# Patient Record
Sex: Female | Born: 1995 | Race: White | Hispanic: No | Marital: Married
Health system: Southern US, Community
[De-identification: ages and names within clinical notes are randomized; demographics above are authoritative.]

## PROBLEM LIST (undated history)

## (undated) ENCOUNTER — Inpatient Hospital Stay (HOSPITAL_COMMUNITY): Payer: Self-pay

## (undated) DIAGNOSIS — Z789 Other specified health status: Secondary | ICD-10-CM

## (undated) HISTORY — PX: WISDOM TOOTH EXTRACTION: SHX21

---

## 2018-05-03 ENCOUNTER — Inpatient Hospital Stay (HOSPITAL_COMMUNITY)
Admission: AD | Admit: 2018-05-03 | Discharge: 2018-05-03 | Disposition: A | Discharge status: Home / Self Care | Payer: BLUE CROSS/BLUE SHIELD | Attending: Obstetrics | Admitting: Obstetrics

## 2018-05-03 ENCOUNTER — Inpatient Hospital Stay (HOSPITAL_COMMUNITY): Payer: BLUE CROSS/BLUE SHIELD

## 2018-05-03 ENCOUNTER — Encounter (HOSPITAL_COMMUNITY): Payer: Self-pay | Admitting: *Deleted

## 2018-05-03 ENCOUNTER — Other Ambulatory Visit: Payer: Self-pay

## 2018-05-03 DIAGNOSIS — O3680X0 Pregnancy with inconclusive fetal viability, not applicable or unspecified: Secondary | ICD-10-CM

## 2018-05-03 DIAGNOSIS — O209 Hemorrhage in early pregnancy, unspecified: Secondary | ICD-10-CM | POA: Diagnosis present

## 2018-05-03 DIAGNOSIS — Z3A01 Less than 8 weeks gestation of pregnancy: Secondary | ICD-10-CM | POA: Insufficient documentation

## 2018-05-03 HISTORY — DX: Other specified health status: Z78.9

## 2018-05-03 LAB — URINALYSIS, ROUTINE W REFLEX MICROSCOPIC
BILIRUBIN URINE: NEGATIVE
Bacteria, UA: NONE SEEN
Glucose, UA: NEGATIVE mg/dL
Ketones, ur: NEGATIVE mg/dL
Leukocytes, UA: NEGATIVE
NITRITE: NEGATIVE
Protein, ur: NEGATIVE mg/dL
Specific Gravity, Urine: 1.01 (ref 1.005–1.030)
pH: 6 (ref 5.0–8.0)

## 2018-05-03 LAB — WET PREP, GENITAL
CLUE CELLS WET PREP: NONE SEEN
Sperm: NONE SEEN
Trich, Wet Prep: NONE SEEN
Yeast Wet Prep HPF POC: NONE SEEN

## 2018-05-03 LAB — TYPE AND SCREEN
ABO/RH(D): A POS
Antibody Screen: NEGATIVE

## 2018-05-03 LAB — HCG, QUANTITATIVE, PREGNANCY: hCG, Beta Chain, Quant, S: 5783 m[IU]/mL — ABNORMAL HIGH (ref <5)

## 2018-05-03 LAB — POCT PREGNANCY, URINE: Preg Test, Ur: POSITIVE — AB

## 2018-05-03 LAB — ABO/RH: ABO/RH(D): A POS

## 2018-05-03 NOTE — MAU Note (Signed)
Having quite a bit of bleeding and cramping, started yesterday morning.  Was worse yesterday.   Has not been seen yet, first appt is Thursday

## 2018-05-03 NOTE — Discharge Instructions (Signed)
Return to Maternity Admissions Unit if you soak through a pad/hour x  Hours or pain is not relieved by Tylenol.

## 2018-05-03 NOTE — MAU Provider Note (Signed)
History     CSN: [id number]  Arrival date and time: 05/03/18 1139   First Provider Initiated Contact with Patient 05/03/18 1454      Chief Complaint  Patient presents with  . Vaginal Bleeding  . Abdominal Pain  . Possible Pregnancy   HPI  [name] is a 23 y.o. year old G1P0 female at 7495w6d weeks gestation who presents to MAU reporting small amount of VB and abdominal cramping that was worse on 05/02/2018. She reports the last SI was "about 1 month ago." [facility]. She has her first OB appt on Thursday 05/06/2018.   Past Medical History:  Diagnosis Date  . Medical history non-contributory     Past Surgical History:  Procedure Laterality Date  . WISDOM TOOTH EXTRACTION      Family History  Problem Relation Age of Onset  . Cancer Paternal Aunt   . Cancer Maternal Grandfather   . Cancer Paternal Grandmother     Social History   Tobacco Use  . Smoking status: Not on file  Substance Use Topics  . Alcohol use: Not Currently  . Drug use: Never    Allergies: No Known Allergies  No medications prior to admission.    Review of Systems  Constitutional: Negative.   HENT: Negative.   Eyes: Negative.   Respiratory: Negative.   Cardiovascular: Negative.   Gastrointestinal: Negative.   Endocrine: Negative.   Genitourinary: Positive for pelvic pain and vaginal bleeding.  Musculoskeletal: Negative.   Skin: Negative.   Allergic/Immunologic: Negative.   Neurological: Negative.   Hematological: Negative.   Psychiatric/Behavioral: Negative.    Physical Exam   Blood pressure 98/63, pulse 94, temperature 98.4 F (36.9 C), temperature source Oral, resp. rate 18, height 5\' 4"  (1.626 m), weight 43.5 kg, last menstrual period 03/16/2018, SpO2 100 %.  Physical Exam  Nursing note and vitals reviewed. Constitutional: She is oriented to person, place, and time. She appears well-developed and well-nourished.  HENT:  Head: Normocephalic and  atraumatic.  Eyes: Pupils are equal, round, and reactive to light.  Neck: Normal range of motion.  Cardiovascular: Normal rate.  Respiratory: Effort normal.  GI: Soft.  Genitourinary:    Genitourinary Comments: Uterus: non-tender, SE: cervix is smooth, pink, no lesions, small amt of thick, brownish red blood in vaginal vault and cervical os -- WP, GC/CT done, closed/long/firm, no CMT or friability, no adnexal tenderness    Musculoskeletal: Normal range of motion.  Neurological: She is alert and oriented to person, place, and time.  Skin: Skin is warm and dry.  Psychiatric: She has a normal mood and affect. Her behavior is normal. Judgment and thought content normal.    MAU Course  Procedures  MDM [facility] UPT CBC ABO/Rh HCG Wet Prep GC/CT -- pending OB < 14 wks US with TV  Results for orders placed or performed during the hospital encounter of 05/03/18 (from the past 24 hour(s))  Urinalysis, Routine w reflex microscopic     Status: Abnormal   Collection Time: 05/03/18 12:31 PM  Result Value Ref Range   Color, Urine YELLOW YELLOW   APPearance CLEAR CLEAR   Specific Gravity, Urine 1.010 1.005 - 1.030   pH 6.0 5.0 - 8.0   Glucose, UA NEGATIVE NEGATIVE mg/dL   Hgb urine dipstick MODERATE (A) NEGATIVE   Bilirubin Urine NEGATIVE NEGATIVE   Ketones, ur NEGATIVE NEGATIVE mg/dL   Protein, ur NEGATIVE NEGATIVE mg/dL   Nitrite NEGATIVE NEGATIVE  Leukocytes, UA NEGATIVE NEGATIVE   RBC / HPF 0-5 0 - 5 RBC/hpf   WBC, UA 0-5 0 - 5 WBC/hpf   Bacteria, UA NONE SEEN NONE SEEN   Squamous Epithelial / LPF 0-5 0 - 5   Mucus PRESENT   Pregnancy, urine POC     Status: Abnormal   Collection Time: 05/03/18 12:36 PM  Result Value Ref Range   Preg Test, Ur POSITIVE (A) NEGATIVE  Type and screen     Status: None   Collection Time: 05/03/18  1:29 PM  Result Value Ref Range   ABO/RH(D) A POS    Antibody Screen NEG    Sample Expiration      05/06/2018 Performed at [facility], [address] [place], [place] [address]   hCG, quantitative, pregnancy     Status: Abnormal   Collection Time: 05/03/18  1:29 PM  Result Value Ref Range   hCG, Beta Chain, Quant, S 5,783 (H) <5 mIU/mL  ABO/Rh     Status: None   Collection Time: 05/03/18  1:29 PM  Result Value Ref Range   ABO/RH(D)      A POS Performed at [facility], [address], [place], [place] [address]   Wet prep, genital     Status: Abnormal   Collection Time: 05/03/18  3:25 PM  Result Value Ref Range   Yeast Wet Prep HPF POC NONE SEEN NONE SEEN   Trich, Wet Prep NONE SEEN NONE SEEN   Clue Cells Wet Prep HPF POC NONE SEEN NONE SEEN   WBC, Wet Prep HPF POC FEW (A) NONE SEEN   Sperm NONE SEEN     US Ob Less Than 14 Weeks With Ob Transvaginal  Result Date: 05/03/2018 CLINICAL DATA:  Vaginal bleeding.  Positive pregnancy test. EXAM: OBSTETRIC <14 WK [place] AND TRANSVAGINAL OB US TECHNIQUE: Both transabdominal and transvaginal ultrasound examinations were performed for complete evaluation of the gestation as well as the maternal uterus, adnexal regions, and pelvic cul-de-sac. Transvaginal technique was performed to assess early pregnancy. COMPARISON:  None. FINDINGS: Intrauterine gestational sac: None Yolk sac:  Not Visualized. Embryo:  Not Visualized. Cardiac Activity: Not Visualized. Maternal uterus/adnexae: No adnexal mass identified. No free fluid in pelvis. Right ovarian corpus luteum. Normal left ovary. IMPRESSION: No intrauterine gestation identified. In the setting of positive pregnancy test and no definite intrauterine pregnancy, this reflects a pregnancy of unknown location. Differential considerations include early normal IUP, abnormal IUP, or nonvisualized ectopic pregnancy. Differentiation is achieved with serial beta HCG supplemented by repeat sonography as clinically warranted. Electronically Signed   By: Annia Belt M.D.   On: 05/03/2018 16:21     Assessment and Plan  Pregnancy of unknown anatomic location   - TC to [facility] to arrange F/U for patient to have rpt HCG on Wednesday 05/05/2018 -- spoke to [place], [place] who will have to ask Dr. Chestine Spore in the morning about F/U. Misty Stanley, RN will call patient tomorrow to advise her if she she is return to MAU for rpt HCG or [facility]  Bleeding in early pregnancy - Return to care   If you have heavier bleeding that soaks through more that 2 pads per hour for an hour or more  If you bleed so much that you feel like you might pass out or you do pass out  If you have significant abdominal pain that is not improved with Tylenol   If you develop a fever > 100.5 - Information provided on VB in  pregnancy  - Discharge patient - Patient verbalized an understanding of the plan of care and agrees.     Raelyn Moraolitta Simrah Chatham, MSN, CNM 05/03/2018, 2:54 PM

## 2018-05-04 LAB — GC/CHLAMYDIA PROBE AMP (~~LOC~~) NOT AT ARMC
Chlamydia: NEGATIVE
Neisseria Gonorrhea: NEGATIVE

## 2019-02-07 DIAGNOSIS — Z0000 Encounter for general adult medical examination without abnormal findings: Secondary | ICD-10-CM | POA: Diagnosis not present

## 2019-02-07 DIAGNOSIS — Z131 Encounter for screening for diabetes mellitus: Secondary | ICD-10-CM | POA: Diagnosis not present

## 2019-02-07 DIAGNOSIS — Z13220 Encounter for screening for lipoid disorders: Secondary | ICD-10-CM | POA: Diagnosis not present

## 2019-02-07 DIAGNOSIS — Z23 Encounter for immunization: Secondary | ICD-10-CM | POA: Diagnosis not present

## 2019-02-07 DIAGNOSIS — Z1331 Encounter for screening for depression: Secondary | ICD-10-CM | POA: Diagnosis not present

## 2019-03-15 ENCOUNTER — Encounter (HOSPITAL_COMMUNITY): Payer: Self-pay

## 2019-03-17 DIAGNOSIS — D2261 Melanocytic nevi of right upper limb, including shoulder: Secondary | ICD-10-CM | POA: Diagnosis not present

## 2019-03-17 DIAGNOSIS — D2272 Melanocytic nevi of left lower limb, including hip: Secondary | ICD-10-CM | POA: Diagnosis not present

## 2019-03-17 DIAGNOSIS — D225 Melanocytic nevi of trunk: Secondary | ICD-10-CM | POA: Diagnosis not present

## 2019-03-17 DIAGNOSIS — D2262 Melanocytic nevi of left upper limb, including shoulder: Secondary | ICD-10-CM | POA: Diagnosis not present

## 2019-06-06 DIAGNOSIS — Z681 Body mass index (BMI) 19 or less, adult: Secondary | ICD-10-CM | POA: Diagnosis not present

## 2019-06-06 DIAGNOSIS — Z01419 Encounter for gynecological examination (general) (routine) without abnormal findings: Secondary | ICD-10-CM | POA: Diagnosis not present

## 2019-06-06 DIAGNOSIS — N979 Female infertility, unspecified: Secondary | ICD-10-CM | POA: Diagnosis not present

## 2019-08-24 DIAGNOSIS — N96 Recurrent pregnancy loss: Secondary | ICD-10-CM | POA: Diagnosis not present

## 2019-08-24 DIAGNOSIS — Z319 Encounter for procreative management, unspecified: Secondary | ICD-10-CM | POA: Diagnosis not present

## 2019-08-24 DIAGNOSIS — E288 Other ovarian dysfunction: Secondary | ICD-10-CM | POA: Diagnosis not present

## 2019-09-19 DIAGNOSIS — N96 Recurrent pregnancy loss: Secondary | ICD-10-CM | POA: Diagnosis not present

## 2019-09-19 DIAGNOSIS — E288 Other ovarian dysfunction: Secondary | ICD-10-CM | POA: Diagnosis not present

## 2019-09-19 DIAGNOSIS — Z319 Encounter for procreative management, unspecified: Secondary | ICD-10-CM | POA: Diagnosis not present

## 2019-09-27 DIAGNOSIS — Z319 Encounter for procreative management, unspecified: Secondary | ICD-10-CM | POA: Diagnosis not present

## 2019-09-27 DIAGNOSIS — E288 Other ovarian dysfunction: Secondary | ICD-10-CM | POA: Diagnosis not present

## 2019-10-14 DIAGNOSIS — E288 Other ovarian dysfunction: Secondary | ICD-10-CM | POA: Diagnosis not present

## 2019-10-14 DIAGNOSIS — Z3141 Encounter for fertility testing: Secondary | ICD-10-CM | POA: Diagnosis not present

## 2019-10-14 DIAGNOSIS — N96 Recurrent pregnancy loss: Secondary | ICD-10-CM | POA: Diagnosis not present

## 2019-10-14 DIAGNOSIS — Z319 Encounter for procreative management, unspecified: Secondary | ICD-10-CM | POA: Diagnosis not present

## 2020-01-04 DIAGNOSIS — R109 Unspecified abdominal pain: Secondary | ICD-10-CM | POA: Diagnosis not present

## 2020-01-04 DIAGNOSIS — Z681 Body mass index (BMI) 19 or less, adult: Secondary | ICD-10-CM | POA: Diagnosis not present

## 2020-01-05 DIAGNOSIS — R109 Unspecified abdominal pain: Secondary | ICD-10-CM | POA: Diagnosis not present

## 2020-01-05 DIAGNOSIS — R1110 Vomiting, unspecified: Secondary | ICD-10-CM | POA: Diagnosis not present

## 2020-01-05 DIAGNOSIS — R112 Nausea with vomiting, unspecified: Secondary | ICD-10-CM | POA: Diagnosis not present

## 2020-03-19 IMAGING — US US OB < 14 WEEKS - US OB TV
1 series · 15 of 26 positions shown · non-contrast
Comparison: None.

CLINICAL DATA: Vaginal bleeding.  Positive pregnancy test.

EXAM:
OBSTETRIC <14 WK US AND TRANSVAGINAL OB US
TECHNIQUE: Both transabdominal and transvaginal ultrasound examinations were
performed for complete evaluation of the gestation as well as the
maternal uterus, adnexal regions, and pelvic cul-de-sac.
Transvaginal technique was performed to assess early pregnancy.

[Series 1: us ob < 14 weeks - us ob tv · 15 of 26 slices shown]
[im 1/26]
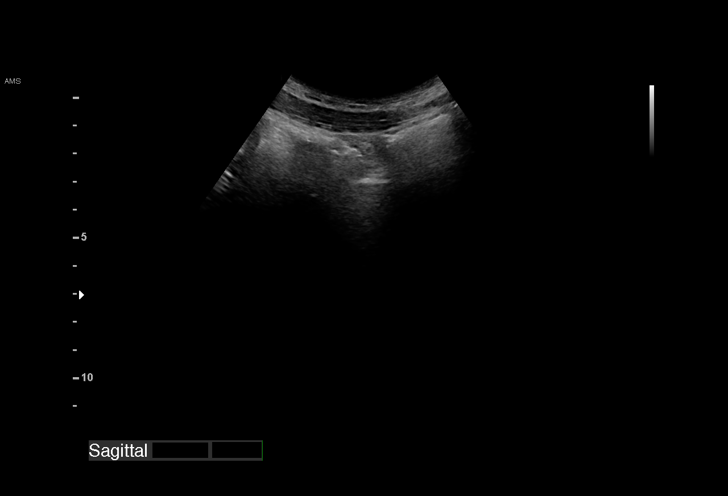
[im 3/26]
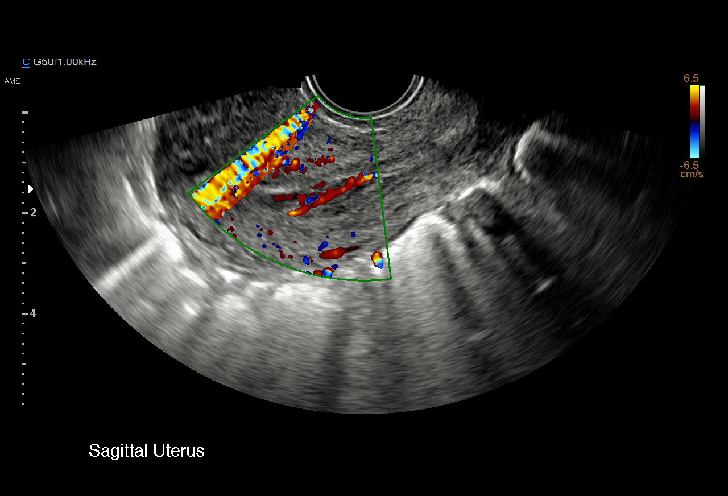
[im 5/26]
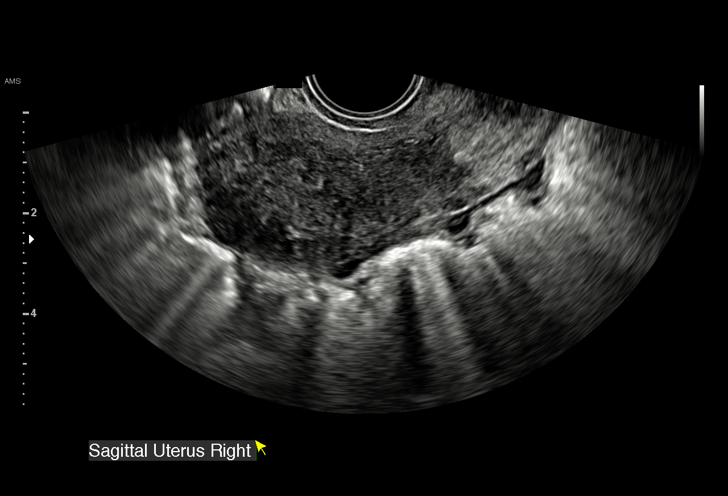
[im 7/26]
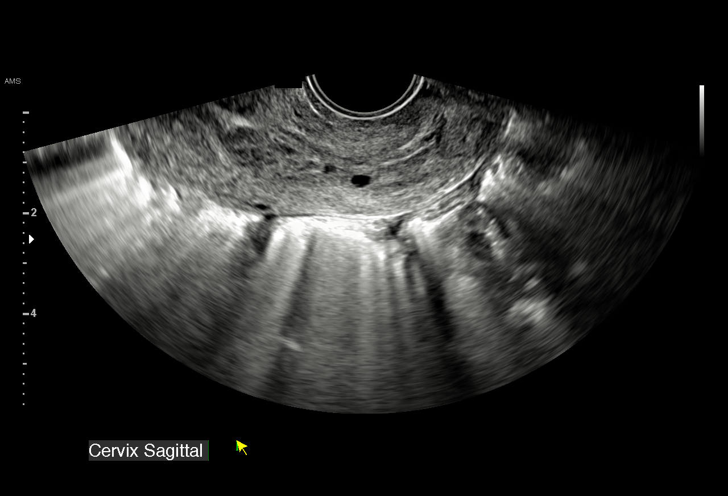
[im 8/26]
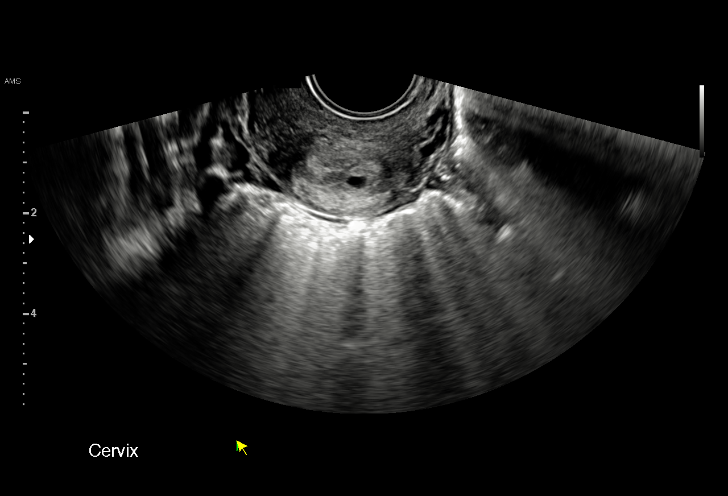
[im 10/26]
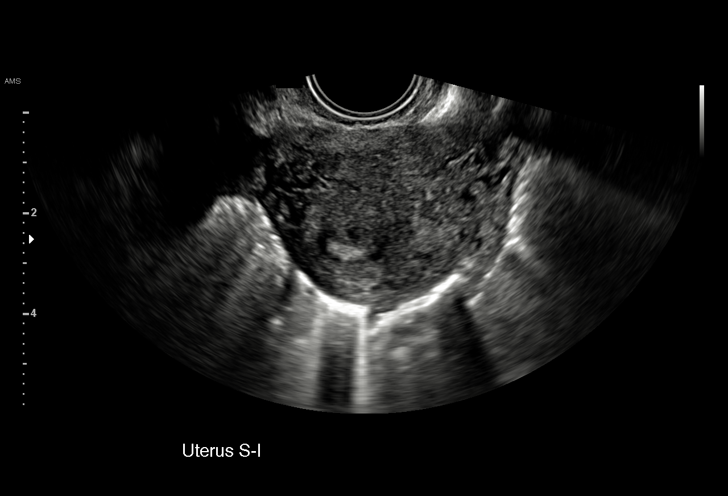
[im 12/26]
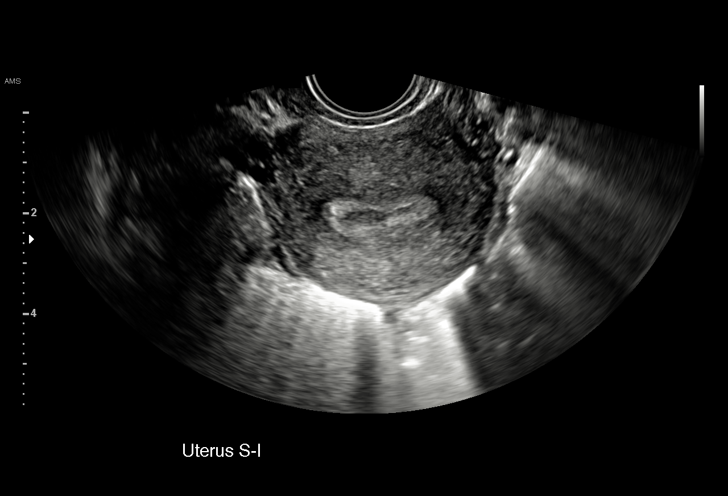
[im 14/26]
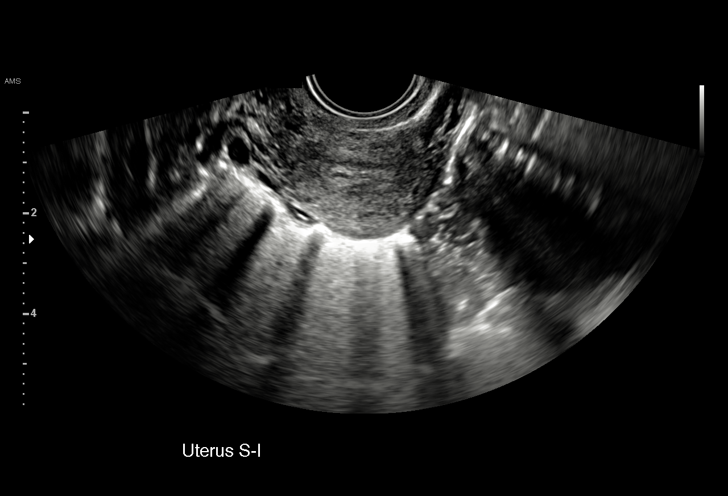
[im 15/26]
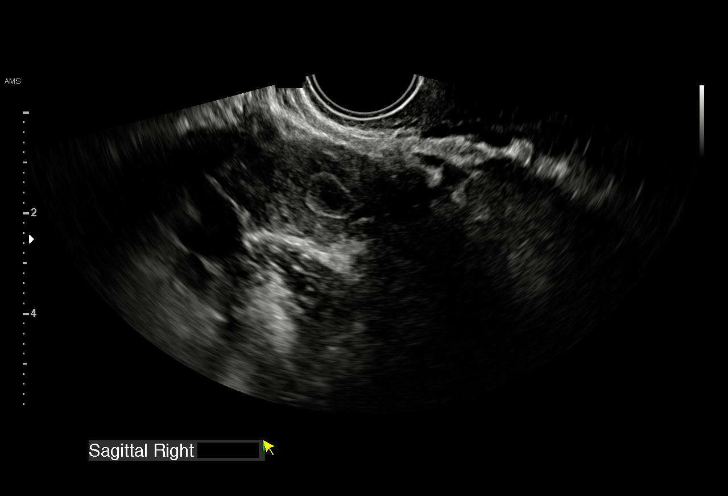
[im 17/26]
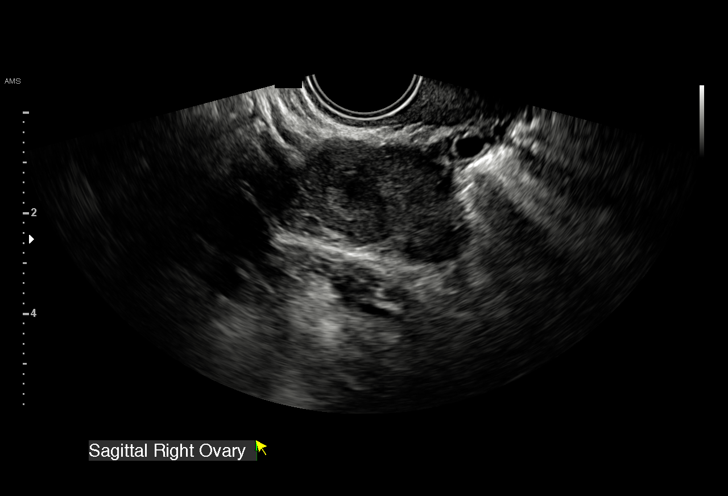
[im 19/26]
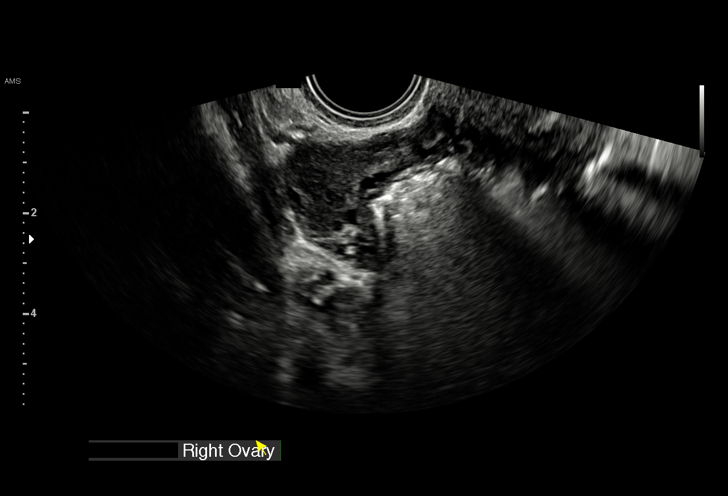
[im 20/26]
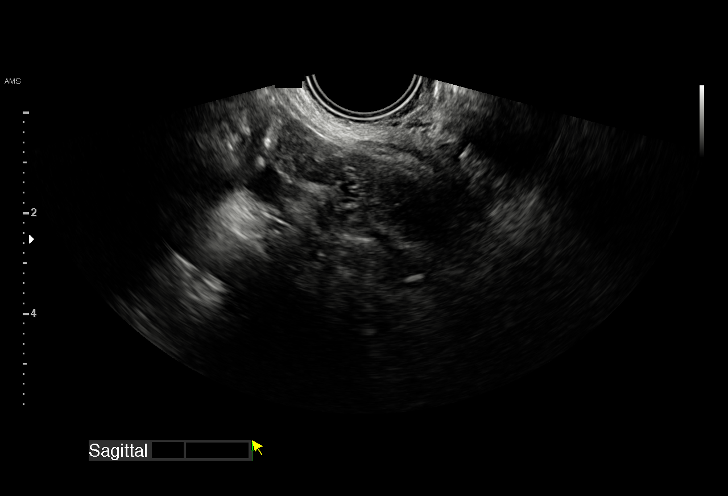
[im 22/26]
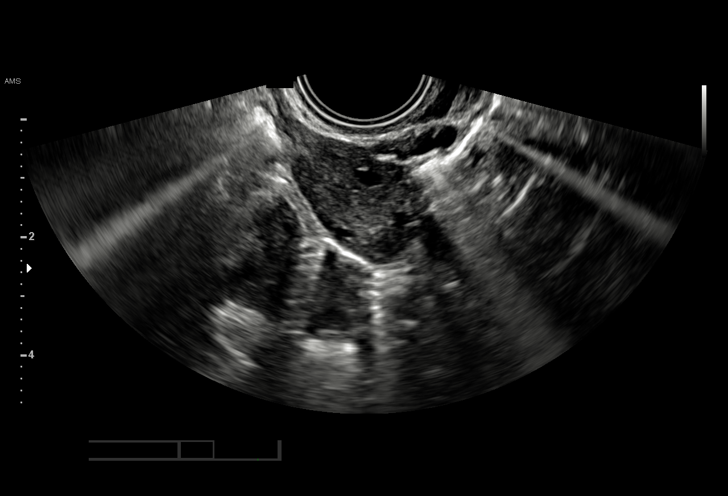
[im 24/26]
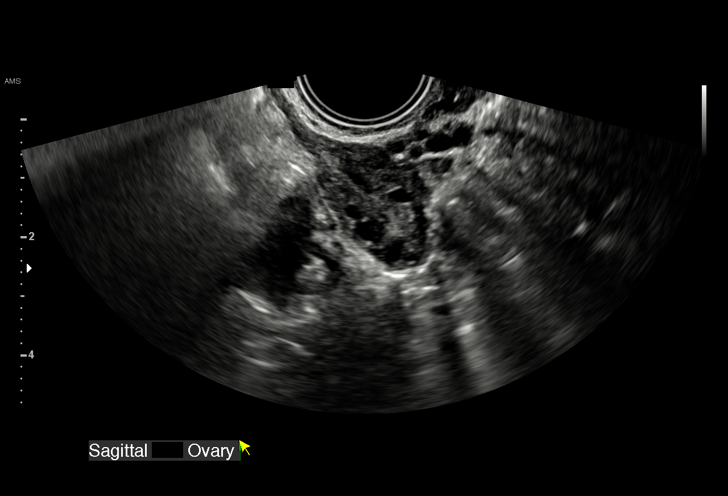
[im 26/26]
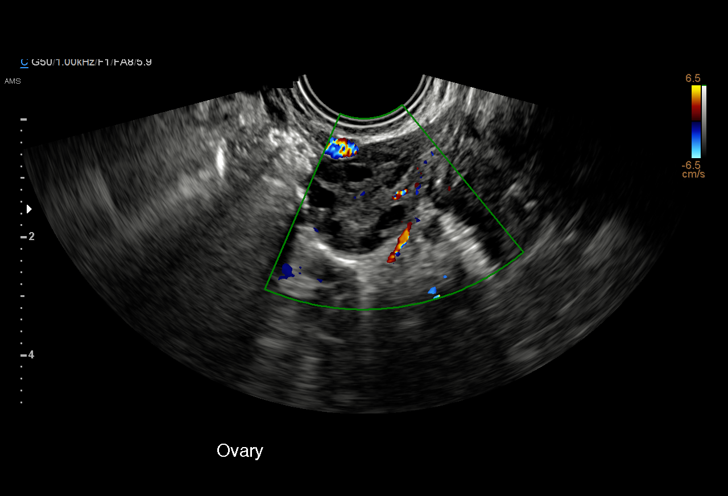

[15 of 26 positions shown; findings below may reference images not displayed]

FINDINGS: Intrauterine gestational sac: None

Yolk sac:  Not Visualized.

Embryo:  Not Visualized.

Cardiac Activity: Not Visualized.

Maternal uterus/adnexae: No adnexal mass identified. No free fluid
in pelvis. Right ovarian corpus luteum. Normal left ovary.
IMPRESSION: No intrauterine gestation identified. In the setting of positive
pregnancy test and no definite intrauterine pregnancy, this reflects
a pregnancy of unknown location. Differential considerations include
early normal IUP, abnormal IUP, or nonvisualized ectopic pregnancy.
Differentiation is achieved with serial beta HCG supplemented by
repeat sonography as clinically warranted.

## 2020-03-22 DIAGNOSIS — L700 Acne vulgaris: Secondary | ICD-10-CM | POA: Diagnosis not present

## 2020-03-22 DIAGNOSIS — D2261 Melanocytic nevi of right upper limb, including shoulder: Secondary | ICD-10-CM | POA: Diagnosis not present

## 2020-03-22 DIAGNOSIS — D225 Melanocytic nevi of trunk: Secondary | ICD-10-CM | POA: Diagnosis not present

## 2020-03-22 DIAGNOSIS — D2262 Melanocytic nevi of left upper limb, including shoulder: Secondary | ICD-10-CM | POA: Diagnosis not present

## 2020-03-22 DIAGNOSIS — D485 Neoplasm of uncertain behavior of skin: Secondary | ICD-10-CM | POA: Diagnosis not present

## 2020-10-19 ENCOUNTER — Inpatient Hospital Stay (HOSPITAL_COMMUNITY)
Admission: AD | Admit: 2020-10-19 | Discharge: 2020-10-19 | Disposition: A | Discharge status: Home / Self Care | Payer: Federal, State, Local not specified - PPO | Attending: Obstetrics and Gynecology | Admitting: Obstetrics and Gynecology

## 2020-10-19 ENCOUNTER — Encounter (HOSPITAL_COMMUNITY): Payer: Self-pay

## 2020-10-19 ENCOUNTER — Other Ambulatory Visit: Payer: Self-pay

## 2020-10-19 ENCOUNTER — Inpatient Hospital Stay (HOSPITAL_COMMUNITY): Payer: Federal, State, Local not specified - PPO

## 2020-10-19 DIAGNOSIS — R109 Unspecified abdominal pain: Secondary | ICD-10-CM | POA: Insufficient documentation

## 2020-10-19 DIAGNOSIS — Z6790 Unspecified blood type, Rh positive: Secondary | ICD-10-CM

## 2020-10-19 DIAGNOSIS — O00201 Right ovarian pregnancy without intrauterine pregnancy: Secondary | ICD-10-CM | POA: Insufficient documentation

## 2020-10-19 DIAGNOSIS — O26899 Other specified pregnancy related conditions, unspecified trimester: Secondary | ICD-10-CM

## 2020-10-19 DIAGNOSIS — Z3A08 8 weeks gestation of pregnancy: Secondary | ICD-10-CM | POA: Diagnosis not present

## 2020-10-19 DIAGNOSIS — O26891 Other specified pregnancy related conditions, first trimester: Secondary | ICD-10-CM | POA: Insufficient documentation

## 2020-10-19 LAB — COMPREHENSIVE METABOLIC PANEL
ALT: 21 U/L (ref 0–44)
AST: 20 U/L (ref 15–41)
Albumin: 4.4 g/dL (ref 3.5–5.0)
Alkaline Phosphatase: 35 U/L — ABNORMAL LOW (ref 38–126)
Anion gap: 9 (ref 5–15)
BUN: 13 mg/dL (ref 6–20)
CO2: 27 mmol/L (ref 22–32)
Calcium: 9.5 mg/dL (ref 8.9–10.3)
Chloride: 101 mmol/L (ref 98–111)
Creatinine, Ser: 0.68 mg/dL (ref 0.44–1.00)
GFR, Estimated: >60 mL/min (ref >60)
Glucose, Bld: 86 mg/dL (ref 70–99)
Potassium: 3.5 mmol/L (ref 3.5–5.1)
Sodium: 137 mmol/L (ref 135–145)
Total Bilirubin: 1.1 mg/dL (ref 0.3–1.2)
Total Protein: 7 g/dL (ref 6.5–8.1)

## 2020-10-19 LAB — CBC
HCT: 41.9 % (ref 36.0–46.0)
Hemoglobin: 14.4 g/dL (ref 12.0–15.0)
MCH: 31.6 pg (ref 26.0–34.0)
MCHC: 34.4 g/dL (ref 30.0–36.0)
MCV: 91.9 fL (ref 80.0–100.0)
Platelets: 231 10*3/uL (ref 150–400)
RBC: 4.56 MIL/uL (ref 3.87–5.11)
RDW: 12 % (ref 11.5–15.5)
WBC: 8.5 10*3/uL (ref 4.0–10.5)
nRBC: 0 % (ref 0.0–0.2)

## 2020-10-19 LAB — HCG, QUANTITATIVE, PREGNANCY: hCG, Beta Chain, Quant, S: 6864 m[IU]/mL — ABNORMAL HIGH (ref <5)

## 2020-10-19 MED ORDER — METHOTREXATE FOR ECTOPIC PREGNANCY
50.0000 mg/m2 | Freq: Once | INTRAMUSCULAR | Status: AC
  Administered 2020-10-19: 71.5 mg via INTRAMUSCULAR
  Filled 2020-10-19: qty 2.86

## 2020-10-19 NOTE — MAU Note (Signed)
Kim Frost is a 25 y.o. here in MAU reporting: was told to come in for an u/s by Dr Timothy Lasso due to abnormal rise in hcg. Hcg on 6/30 was 3392 and then yesterday it was 4900. Is not having any pain or bleeding.  LMP: 08/21/2020  Onset of complaint: ongoing  Pain score: 0/10  Vitals:   10/19/20 1749  BP: 121/85  Pulse: 78  Resp: 16  Temp: 97.8 F (36.6 C)  SpO2: 100%     Lab orders placed from triage: none

## 2020-10-19 NOTE — MAU Provider Note (Addendum)
History     CSN: [id number]  Arrival date and time: 10/19/20 1703   Chief Complaint  Patient presents with   Ultrasound   25 y.o. G2P0010 @[redacted]w[redacted]d  by LMP presenting for . Reports abnormal rising qhcg. Denies VB and pain today but was having spotting prior to. She is getting care at [facility].   OB History     Gravida  2   Para      Term      Preterm      AB      Living         SAB      IAB      Ectopic      Multiple      Live Births              Past Medical History:  Diagnosis Date   Medical history non-contributory     Past Surgical History:  Procedure Laterality Date   WISDOM TOOTH EXTRACTION      Family History  Problem Relation Age of Onset   Cancer Paternal Aunt    Cancer Maternal Grandfather    Cancer Paternal Grandmother     Social History   Substance Use Topics   Alcohol use: Not Currently   Drug use: Never    Allergies: No Known Allergies  No medications prior to admission.    Review of Systems  Gastrointestinal:  Negative for abdominal pain.  Genitourinary:  Negative for vaginal bleeding.  Physical Exam   Blood pressure 100/65, pulse 75, temperature 97.8 F (36.6 C), temperature source Oral, resp. rate 16, height 5\' 4"  (1.626 m), weight 45.5 kg, last menstrual period 08/21/2020, SpO2 100 %, unknown if currently breastfeeding.  Physical Exam Vitals and nursing note reviewed. Exam conducted with a chaperone present.  Constitutional:      Appearance: Normal appearance.  HENT:     Head: Normocephalic and atraumatic.  Cardiovascular:     Rate and Rhythm: Normal rate.  Pulmonary:     Effort: Pulmonary effort is normal. No respiratory distress.  Abdominal:     General: There is no distension.     Palpations: Abdomen is soft. There is no mass.     Tenderness: There is no abdominal tenderness. There is no guarding or rebound.     Hernia: No hernia is present.  Musculoskeletal:        General: Normal range of  motion.     Cervical back: Normal range of motion.  Skin:    General: Skin is warm and dry.  Neurological:     General: No focal deficit present.     Mental Status: She is alert and oriented to person, place, and time.  Psychiatric:        Mood and Affect: Mood normal.        Behavior: Behavior normal.   Results for orders placed or performed during the hospital encounter of 10/19/20 (from the past 24 hour(s))  CBC     Status: None   Collection Time: 10/19/20  6:18 PM  Result Value Ref Range   WBC 8.5 4.0 - 10.5 K/uL   RBC 4.56 3.87 - 5.11 MIL/uL   Hemoglobin 14.4 12.0 - 15.0 g/dL   HCT 12/20/20 12/20/20 - 88.2 %   MCV 91.9 80.0 - 100.0 fL   MCH 31.6 26.0 - 34.0 pg   MCHC 34.4 30.0 - 36.0 g/dL   RDW 80.0 34.9 - 17.9 %   Platelets 231 150 -  400 K/uL   nRBC 0.0 0.0 - 0.2 %  Comprehensive metabolic panel     Status: Abnormal   Collection Time: 10/19/20  6:18 PM  Result Value Ref Range   Sodium 137 135 - 145 mmol/L   Potassium 3.5 3.5 - 5.1 mmol/L   Chloride 101 98 - 111 mmol/L   CO2 27 22 - 32 mmol/L   Glucose, Bld 86 70 - 99 mg/dL   BUN 13 6 - 20 mg/dL   Creatinine, Ser 6.96 0.44 - 1.00 mg/dL   Calcium 9.5 8.9 - 29.5 mg/dL   Total Protein 7.0 6.5 - 8.1 g/dL   Albumin 4.4 3.5 - 5.0 g/dL   AST 20 15 - 41 U/L   ALT 21 0 - 44 U/L   Alkaline Phosphatase 35 (L) 38 - 126 U/L   Total Bilirubin 1.1 0.3 - 1.2 mg/dL   GFR, Estimated >28 >41 mL/min   Anion gap 9 5 - 15  hCG, quantitative, pregnancy     Status: Abnormal   Collection Time: 10/19/20  6:18 PM  Result Value Ref Range   hCG, Beta Chain, Quant, S 6,864 (H) <5 mIU/mL    US OB LESS THAN 14 WEEKS WITH OB TRANSVAGINAL  Result Date: 10/19/2020 CLINICAL DATA:  25 year old pregnant female with abnormal rise in HCG. LMP: 08/21/2020 corresponding to an estimated gestational age of [redacted] weeks, 3 days. EXAM: OBSTETRIC <14 WK [place] AND TRANSVAGINAL OB US TECHNIQUE: Both transabdominal and transvaginal ultrasound examinations were performed for  complete evaluation of the gestation as well as the maternal uterus, adnexal regions, and pelvic cul-de-sac. Transvaginal technique was performed to assess early pregnancy. COMPARISON:  None. FINDINGS: The uterus is anteverted and appears unremarkable. The endometrium measures approximately 9 mm in thickness and appears unremarkable. No intrauterine pregnancy identified. The right ovary measures 3.4 x 2.5 x 3.2 cm and appears unremarkable. A corpus luteum is noted in the right ovary. There is a 1.9 x 2.0 x 1.7 cm complex mass with central cystic or sac-like component superior and medial to the right ovary. The this mass appears to be separate from the right ovary and most concerning for an ectopic pregnancy. No fetal pole or yolk sac identified within the sac-like component of this mass. The left ovary is unremarkable and measures 3.2 x 2.1 x 1.8 cm. No significant free fluid within the pelvis. IMPRESSION: 1. No intrauterine pregnancy identified. 2. Findings most concerning for a right adnexal ectopic pregnancy. Clinical correlation is recommended. These results were called by telephone at the time of interpretation on 10/19/2020 at 7:56 pm to provider [facility] , who verbally acknowledged these results. Electronically Signed   By: Elgie Collard M.D.   On: 10/19/2020 19:57    MAU Course  Procedures MTX  MDM TC to Dr. Timothy Lasso requesting more information including quant results from office> qhcg on 6/30 was 3300, qhcg on 7/7 was 4900. [place] ordered today.  Labs and [place] reviewed. Discussed presentation and clinical findings with Dr. Charlotta Newton and Dr. Timothy Lasso, both ok with offering MTX or surgical mngt. Pt and partner notified of US findings, options of mngt discussed in detail. Pt prefers MTX. The risks of methotrexate were reviewed including failure requiring repeat dosing or eventual surgery. She understands that methotrexate involves frequent return visits to monitor lab values and that she remains at risk  of ectopic rupture until her beta is less than assay. The patient opts to proceed with methotrexate. She has no history of hepatic or  renal dysfunction, has normal BUN/Cr/LFT's/platelets. She is felt to be reliable for follow-up. Side effects of photosensitivity & GI upset were discussed. She knows to avoid direct sunlight and abstain from alcohol, aspirin and aspirin-like products for two weeks. She was counseled to discontinue any MVI with folic acid. She understands to follow up on D4 (7/11) and D7 (7/14) for repeat BHCG and was given the instruction sheet. Strict ectopic precautions were reviewed, the patient knows to call with any abdominal pain, vomiting, fainting, or any concerns with her health. Rh pos. Dr. Timothy Lasso updated with plan and will arrange f/u in office on 10/22/20 am.  MTX given, pt stable for discharge.  Assessment and Plan   1. Ectopic pregnancy of right ovary   2. Abdominal pain affecting pregnancy   3. Blood type, Rh positive    Discharge home Follow up at [facility] on 10/22/20 b/w 8:30-11 for labs Strict ectopic precautions Avoid NSAIDS and folic acid  Allergies as of 10/19/2020   No Known Allergies      Medication List     TAKE these medications    prenatal multivitamin Tabs tablet Take 1 tablet by mouth at bedtime.       Donette Larry, CNM 10/19/20, 2100

## 2020-10-20 ENCOUNTER — Encounter (HOSPITAL_COMMUNITY): Payer: Self-pay | Admitting: Obstetrics and Gynecology

## 2020-10-22 ENCOUNTER — Inpatient Hospital Stay (HOSPITAL_COMMUNITY)
Admission: AD | Admit: 2020-10-22 | Discharge: 2020-10-22 | Disposition: A | Discharge status: Home / Self Care | Payer: Federal, State, Local not specified - PPO | Attending: Obstetrics & Gynecology | Admitting: Obstetrics & Gynecology

## 2020-10-22 ENCOUNTER — Other Ambulatory Visit: Payer: Self-pay

## 2020-10-22 DIAGNOSIS — Z7901 Long term (current) use of anticoagulants: Secondary | ICD-10-CM | POA: Diagnosis not present

## 2020-10-22 DIAGNOSIS — O26891 Other specified pregnancy related conditions, first trimester: Secondary | ICD-10-CM | POA: Diagnosis present

## 2020-10-22 DIAGNOSIS — R1031 Right lower quadrant pain: Secondary | ICD-10-CM | POA: Diagnosis not present

## 2020-10-22 DIAGNOSIS — O00201 Right ovarian pregnancy without intrauterine pregnancy: Secondary | ICD-10-CM | POA: Diagnosis not present

## 2020-10-22 DIAGNOSIS — Z3A08 8 weeks gestation of pregnancy: Secondary | ICD-10-CM | POA: Diagnosis not present

## 2020-10-22 DIAGNOSIS — O26851 Spotting complicating pregnancy, first trimester: Secondary | ICD-10-CM | POA: Insufficient documentation

## 2020-10-22 LAB — HCG, QUANTITATIVE, PREGNANCY: hCG, Beta Chain, Quant, S: 1768 m[IU]/mL — ABNORMAL HIGH (ref <5)

## 2020-10-22 NOTE — MAU Note (Signed)
Started having pain yesterday afternoon, just never went away.  Took some Tylenol last night.- never helped.  Called office.  Told to come in. Cramping in RLQ- goes down rt leg. Started bleeding this morning, small amt.  Today is day 4 post Methotrexate.

## 2020-10-22 NOTE — MAU Provider Note (Signed)
Chief Complaint:  Abdominal Pain and Vaginal Bleeding   Event Date/Time   First Provider Initiated Contact with Patient 10/22/20 1010     HPI: Kim Frost is a 25 y.o. G2P0010 at [redacted]w[redacted]d who presents to maternity admissions reporting cramping. Patient reports gradual onset of constant, dull, right lower quadrant cramping that started yesterday afternoon and has continued to linger throughout the morning. Reports she "just couldn't get comfortable or sleep". She rates her pain 4/10. She took 1 500mg  Tylenol last night, which did not help her pain. Started having some light spotting this morning. She was diagnosed with a right ectopic pregnancy on Friday which she received MTX. She called the office as she was suppose to have follow up today for hcg level, however was told to come in for further evaluation.   Pregnancy Course:   Past Medical History:  Diagnosis Date   Medical history non-contributory    OB History  Gravida Para Term Preterm AB Living  2       1    SAB IAB Ectopic Multiple Live Births  1            # Outcome Date GA Lbr Len/2nd Weight Sex Delivery Anes PTL Lv  2 Current           1 SAB  [redacted]w[redacted]d          Past Surgical History:  Procedure Laterality Date   WISDOM TOOTH EXTRACTION     Family History  Problem Relation Age of Onset   Cancer Paternal Aunt    Cancer Maternal Grandfather    Cancer Paternal Grandmother    Social History   Substance Use Topics   Alcohol use: Not Currently   Drug use: Never   No Known Allergies Medications Prior to Admission  Medication Sig Dispense Refill Last Dose   Prenatal Vit-Fe Fumarate-FA (PRENATAL MULTIVITAMIN) TABS tablet Take 1 tablet by mouth at bedtime.      I have reviewed patient's Past Medical Hx, Surgical Hx, Family Hx, Social Hx, medications and allergies.   ROS:  Review of Systems  Constitutional: Negative.   Respiratory: Negative.    Cardiovascular: Negative.   Gastrointestinal:  Positive for abdominal pain and  nausea. Negative for diarrhea and vomiting.       Right lower quadrant   Genitourinary:  Negative for dysuria, pelvic pain and vaginal discharge.       Spotting   Musculoskeletal: Negative.   Neurological: Negative.   Psychiatric/Behavioral: Negative.     Physical Exam  Patient Vitals for the past 24 hrs:  BP Temp Temp src Pulse Resp SpO2 Height Weight  10/22/20 1002 128/78 98.1 F (36.7 C) Oral 71 16 100 % 5\' 4"  (1.626 m) 44.9 kg   Constitutional: well-developed, well-nourished female in no acute distress.  Cardiovascular: normal rate Respiratory: normal effort GI: abd flat, soft, non-tender, non-distended, no guarding, no rebound tenderness MS: extremities nontender, no edema, normal ROM Neurologic: alert and oriented x 4.     Labs: Results for orders placed or performed during the hospital encounter of 10/22/20 (from the past 24 hour(s))  hCG, quantitative, pregnancy     Status: Abnormal   Collection Time: 10/22/20 10:07 AM  Result Value Ref Range   hCG, Beta Chain, Quant, S 1,768 (H) <5 mIU/mL    Imaging:    MAU Course: Orders Placed This Encounter  Procedures   hCG, quantitative, pregnancy   No orders of the defined types were placed in this encounter.  MDM: S/p MTX day 4 HCG drop from 6,864 to 1,768 Abdominal exam benign with no abdominal tenderness, guarding, or distention   Discussed with Dr. Adrian Blackwater who is okay with patient being discharged home with follow up in office on Thursday Strict return precautions reviewed with patient    Assessment: 1. Ectopic pregnancy of right ovary     Plan: Discharge home in stable condition Follow up at [facility] on Thursday for day 7 hcg.      Allergies as of 10/22/2020   No Known Allergies      Medication List     TAKE these medications    prenatal multivitamin Tabs tablet Take 1 tablet by mouth at bedtime.          Camelia Eng, MSN, CNM 10/22/2020 10:37 AM

## 2022-09-05 IMAGING — US US OB < 14 WEEKS - US OB TV
1 series · 15 of 28 positions shown · non-contrast
Comparison: None.

CLINICAL DATA: 24-year-old pregnant female with abnormal rise in
HCG. LMP: 08/21/2020 corresponding to an estimated gestational age
of 8 weeks, 3 days.

EXAM:
OBSTETRIC <14 WK US AND TRANSVAGINAL OB US
TECHNIQUE: Both transabdominal and transvaginal ultrasound examinations were
performed for complete evaluation of the gestation as well as the
maternal uterus, adnexal regions, and pelvic cul-de-sac.
Transvaginal technique was performed to assess early pregnancy.

[Series 1: us ob < 14 weeks - us ob tv · 64 acquisitions, 15 frames shown]
[im 1/64]
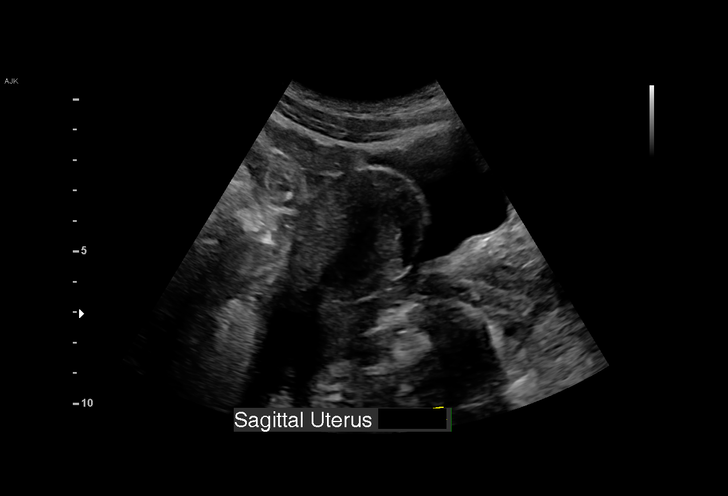
[im 5/64]
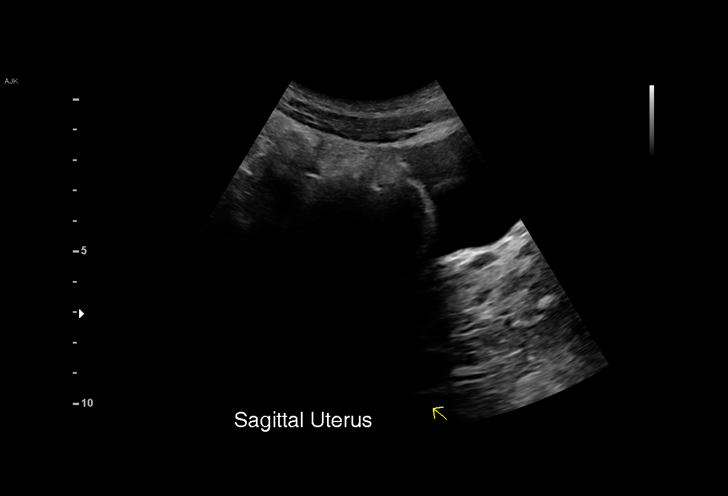
[im 10/64]
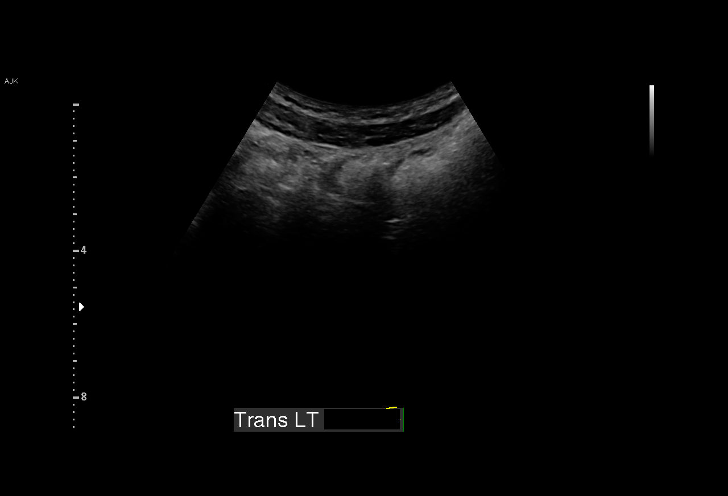
[im 15/64]
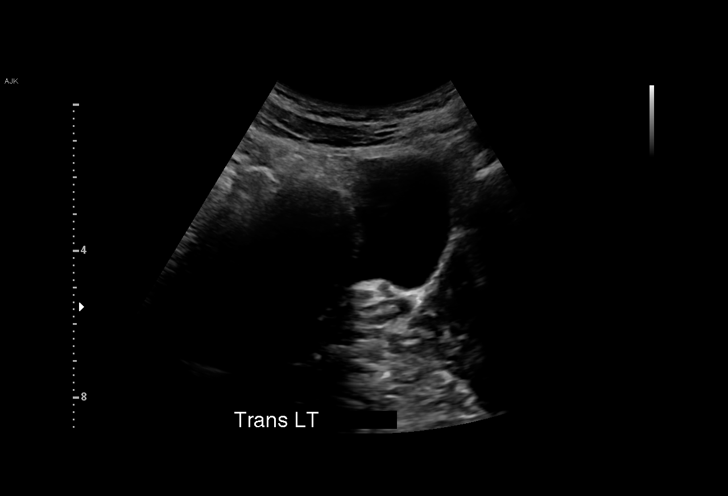
[im 19/64]
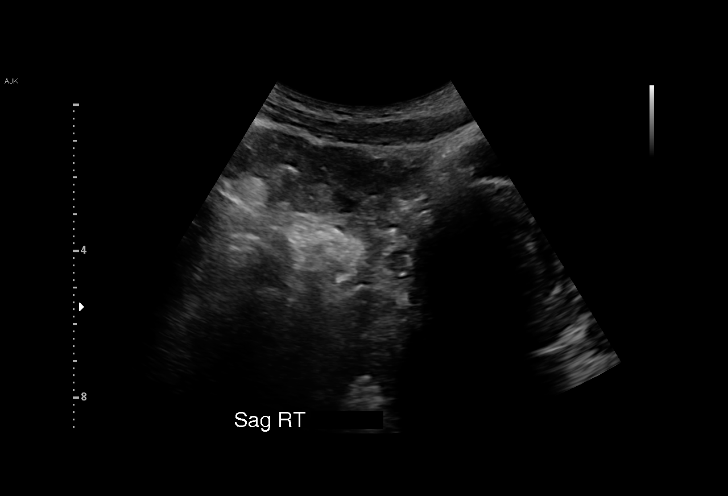
[im 24/64]
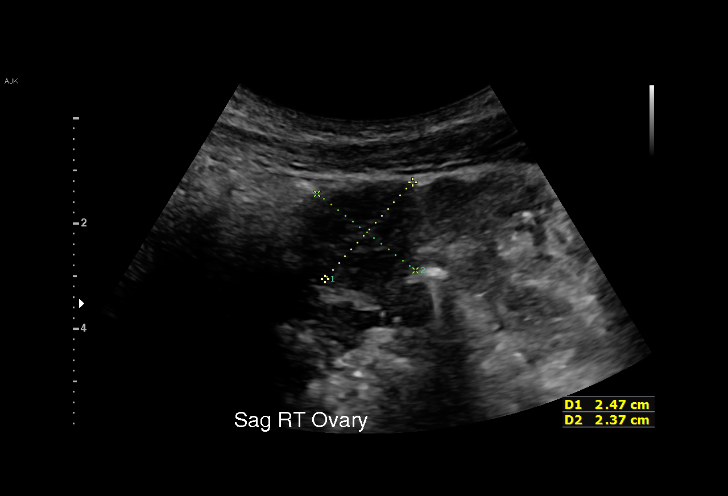
[im 29/64]
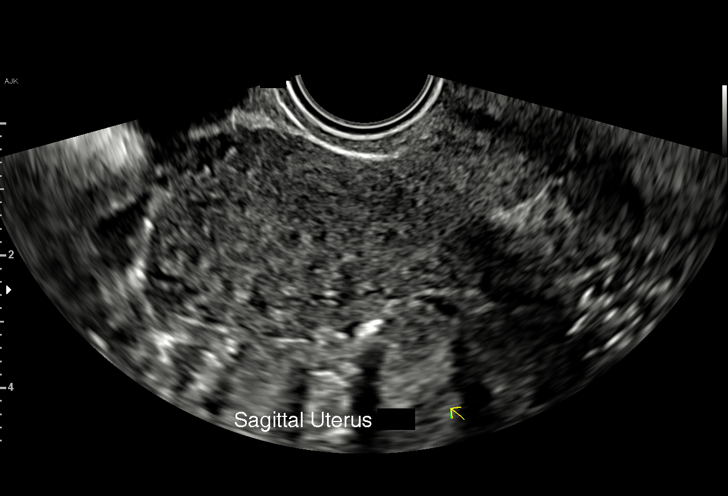
[im 33/64]
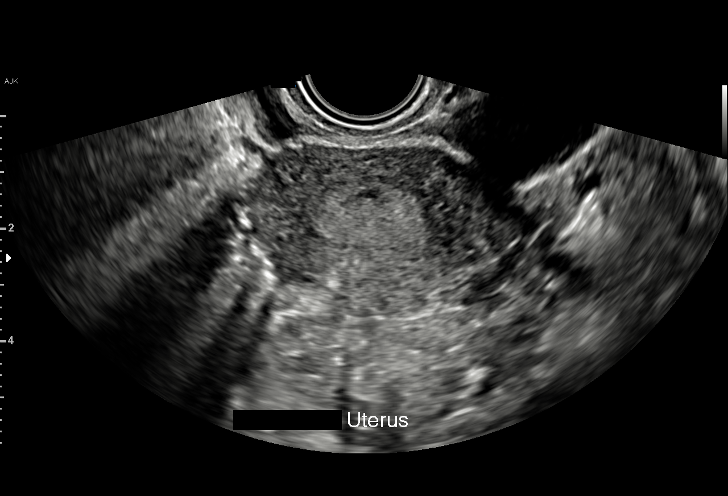
[im 36/64]
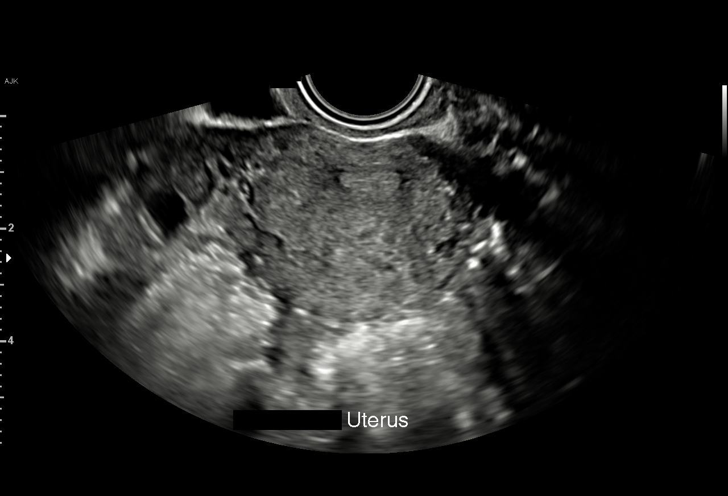
[im 40/64]
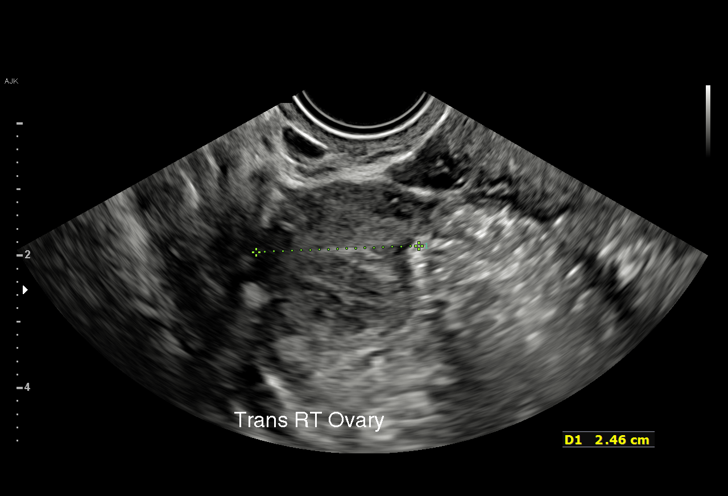
[im 45/64]
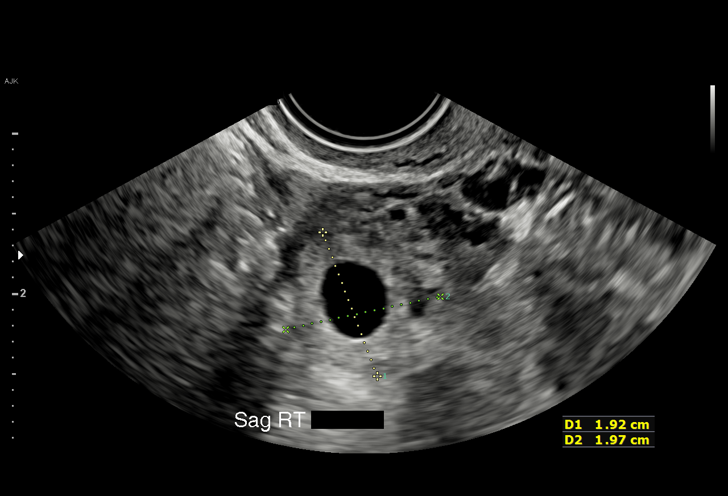
[im 50/64]
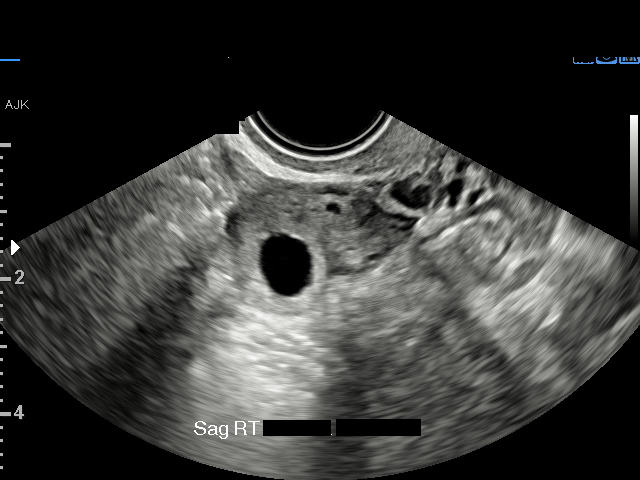
[im 54/64]
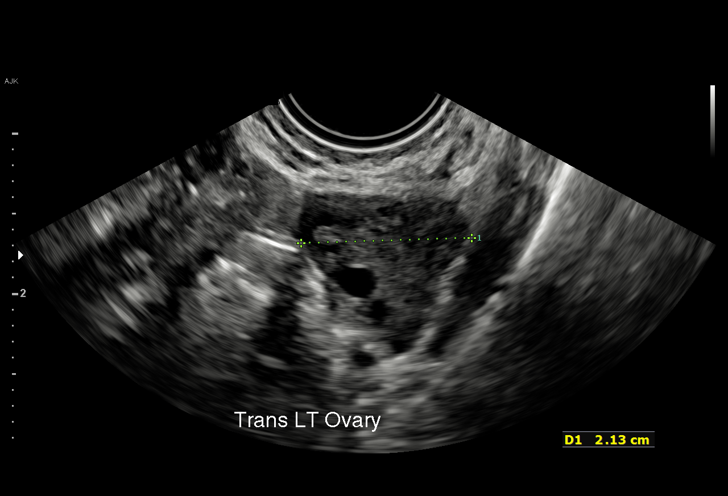
[im 59/64]
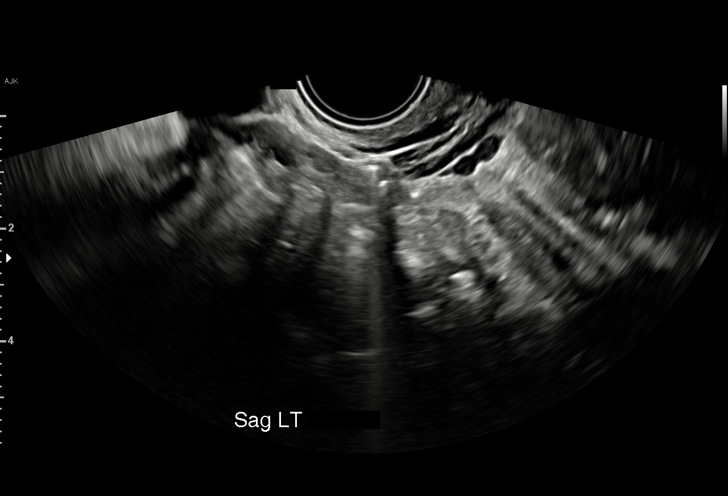
[im 64/64]
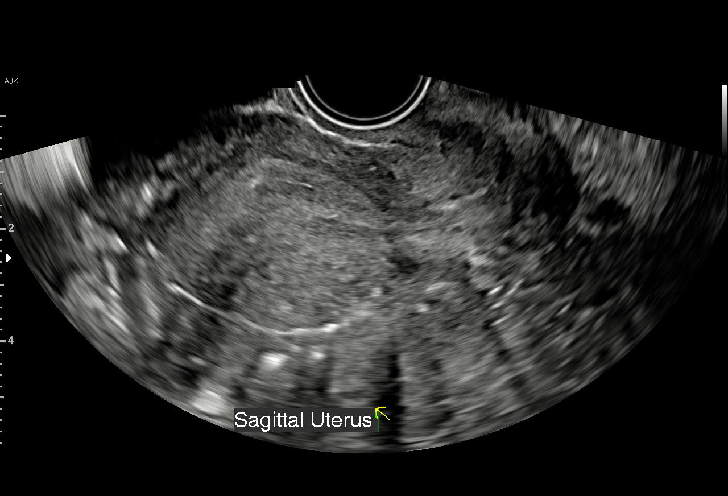

[15 of 28 positions shown; findings below may reference images not displayed]

FINDINGS: The uterus is anteverted and appears unremarkable.

The endometrium measures approximately 9 mm in thickness and appears
unremarkable. No intrauterine pregnancy identified.

The right ovary measures 3.4 x 2.5 x 3.2 cm and appears
unremarkable. A corpus luteum is noted in the right ovary.

There is a 1.9 x 2.0 x 1.7 cm complex mass with central cystic or
sac-like component superior and medial to the right ovary. The this
mass appears to be separate from the right ovary and most concerning
for an ectopic pregnancy. No fetal pole or yolk sac identified
within the sac-like component of this mass.

The left ovary is unremarkable and measures 3.2 x 2.1 x 1.8 cm.

No significant free fluid within the pelvis.
IMPRESSION: 1. No intrauterine pregnancy identified.
2. Findings most concerning for a right adnexal ectopic pregnancy.
Clinical correlation is recommended.

These results were called by telephone at the time of interpretation
on 10/19/2020 at [DATE] to provider J EMANUEL BOOS , who verbally
acknowledged these results.
